# Patient Record
Sex: Male | Born: 1965 | Race: Black or African American | Hispanic: No | Marital: Single | Smoking: Never smoker
Health system: Southern US, Community
[De-identification: ages and names within clinical notes are randomized; demographics above are authoritative.]

## PROBLEM LIST (undated history)

## (undated) DIAGNOSIS — I10 Essential (primary) hypertension: Secondary | ICD-10-CM

## (undated) HISTORY — PX: TOOTH EXTRACTION: SUR596

---

## 2016-07-10 ENCOUNTER — Encounter (HOSPITAL_COMMUNITY): Payer: Self-pay | Admitting: Emergency Medicine

## 2016-07-10 ENCOUNTER — Ambulatory Visit (HOSPITAL_COMMUNITY)
Admission: EM | Admit: 2016-07-10 | Discharge: 2016-07-10 | Disposition: A | Discharge status: Home / Self Care | Payer: Self-pay | Attending: Family Medicine | Admitting: Family Medicine

## 2016-07-10 DIAGNOSIS — R319 Hematuria, unspecified: Secondary | ICD-10-CM

## 2016-07-10 DIAGNOSIS — I10 Essential (primary) hypertension: Secondary | ICD-10-CM

## 2016-07-10 LAB — POCT URINALYSIS DIP (DEVICE)
Bilirubin Urine: NEGATIVE
Glucose, UA: NEGATIVE mg/dL
Ketones, ur: NEGATIVE mg/dL
Leukocytes, UA: NEGATIVE
Nitrite: NEGATIVE
PH: 5.5 (ref 5.0–8.0)
PROTEIN: NEGATIVE mg/dL
SPECIFIC GRAVITY, URINE: 1.025 (ref 1.005–1.030)
UROBILINOGEN UA: 0.2 mg/dL (ref 0.0–1.0)

## 2016-07-10 MED ORDER — HYDROCHLOROTHIAZIDE 25 MG PO TABS: 25.0000 mg | ORAL_TABLET | Freq: Every day | ORAL | 0 refills | Status: DC

## 2016-07-10 NOTE — ED Triage Notes (Signed)
Pt reports x1 episode of hematuria on 9/14  Reports "discomfort in the genital area"... 1/10  Denies inj/trauma, penile d/c, dysuria  A&O x4... NAD

## 2016-07-10 NOTE — ED Provider Notes (Signed)
CSN: [id number]     Arrival date & time 07/10/16  1727 History   First MD Initiated Contact with Patient 07/10/16 1829     Chief Complaint  Patient presents with  . Hematuria   (Consider location/radiation/quality/duration/timing/severity/associated sxs/prior Treatment) Javier Harmon is a well-appearing 50 y.o male, with hx of HTN, has no PCP, presents today for hematuria. He noticed blood in his urine 3 days ago. He said his urine had a pink-tinge color to it. He denies dysuria, no urinary frequency, or urinary urgency. He is sexually active with 1 partner. He denies penile discharge, penile pain, penile rash, testicular pain/swelling/redness. He has no recent weight loss, no abdominal pain, no N/V.   Noted his BP to be elevated today. He denies dizziness, blurry vision, headache, chest pain.       History reviewed. No pertinent past medical history. History reviewed. No pertinent surgical history. History reviewed. No pertinent family history. Social History  Substance Use Topics  . Smoking status: Never Smoker  . Smokeless tobacco: Never Used  . Alcohol use Yes    Review of Systems  Constitutional: Negative for chills, fatigue, fever and unexpected weight change.  Eyes: Negative for visual disturbance.  Respiratory: Negative for cough and shortness of breath.   Cardiovascular: Negative for chest pain, palpitations and leg swelling.  Gastrointestinal: Negative for abdominal pain, diarrhea, nausea and vomiting.  Genitourinary: Positive for flank pain and hematuria. Negative for decreased urine volume, discharge, dysuria, frequency, penile pain, penile swelling, scrotal swelling, testicular pain and urgency.  Skin: Negative for rash.  Neurological: Negative for dizziness, weakness and headaches.    Allergies  Review of patient's allergies indicates no known allergies.  Home Medications   Prior to Admission medications   Medication Sig Start Date End Date Taking? Authorizing  Provider  hydrochlorothiazide (HYDRODIURIL) 25 MG tablet Take 1 tablet (25 mg total) by mouth daily. 07/10/16   Lucia EstelleFeng Solita Macadam, NP   Meds Ordered and Administered this Visit  Medications - No data to display  BP 190/85 (BP Location: Left Arm)   Pulse 65   Temp 98.8 F (37.1 C) (Oral)   Resp 18   SpO2 100%  No data found.   Physical Exam  Constitutional: He is oriented to person, place, and time. He appears well-developed and well-nourished. No distress.  HENT:  Head: Normocephalic and atraumatic.  Eyes: EOM are normal. Pupils are equal, round, and reactive to light.  Neck: Normal range of motion. Neck supple.  Cardiovascular: Normal rate, regular rhythm, normal heart sounds and intact distal pulses.   No murmur heard. Pulmonary/Chest: Effort normal and breath sounds normal. No respiratory distress.  Abdominal: Soft. Bowel sounds are normal. He exhibits no distension. There is no tenderness.  Musculoskeletal: Normal range of motion.  Neurological: He is alert and oriented to person, place, and time.  Skin: Skin is warm. He is not diaphoretic.  Nursing note and vitals reviewed.   Urgent Care Course   Clinical Course    Procedures (including critical care time)  Labs Review Labs Reviewed  POCT URINALYSIS DIP (DEVICE) - Abnormal; Notable for the following:       Result Value   Hgb urine dipstick SMALL (*)    All other components within normal limits    Imaging Review No results found.      MDM   1. Hematuria   2. Essential hypertension    Patient appears well. His exam was normal. UA pos for Hgb. He is otherwise asymptomatic.  Hematuria: f/u with [facility]. Potential differentials discussed with patient. Call tomorrow to make an appt  HTN: Start HCTZ 25mg . Need to establish care with a PCP no later than 2 weeks to have HTN re-evaluation.    Lucia Estelle, NP 07/10/16 [phone]

## 2017-02-26 ENCOUNTER — Encounter (HOSPITAL_COMMUNITY): Payer: Self-pay

## 2017-02-26 ENCOUNTER — Ambulatory Visit (HOSPITAL_COMMUNITY)
Admission: EM | Admit: 2017-02-26 | Discharge: 2017-02-26 | Disposition: A | Discharge status: Home / Self Care | Payer: Self-pay | Attending: Internal Medicine | Admitting: Internal Medicine

## 2017-02-26 DIAGNOSIS — H1033 Unspecified acute conjunctivitis, bilateral: Secondary | ICD-10-CM

## 2017-02-26 DIAGNOSIS — I10 Essential (primary) hypertension: Secondary | ICD-10-CM

## 2017-02-26 MED ORDER — ERYTHROMYCIN 5 MG/GM OP OINT: TOPICAL_OINTMENT | OPHTHALMIC | 0 refills | Status: AC

## 2017-02-26 MED ORDER — HYDROCHLOROTHIAZIDE 25 MG PO TABS: 25.0000 mg | ORAL_TABLET | Freq: Every day | ORAL | 2 refills | Status: AC

## 2017-02-26 NOTE — ED Provider Notes (Signed)
CSN: [id number]     Arrival date & time 02/26/17  1213 History   First MD Initiated Contact with Patient 02/26/17 1323     Chief Complaint  Patient presents with  . Conjunctivitis   (Consider location/radiation/quality/duration/timing/severity/associated sxs/prior Treatment) 51 year old male presents to clinic for evaluation of bilateral conjunctivitis ongoing for 1 week, worsening, tried over-the-counter eyedrops without relief. Denies pain, denies blurred vision, denies photosensitivity, denies alterations in vision.  Also has hypertension, does not have primary care, was seen in this office back in September, started on hydrochlorothiazide, given a one-month supply, he did not establish with primary care. Current blood pressure 179 /138, denies chest pain, shortness of breath, weakness, dizziness, blurred vision, swelling hands, feet, ankles, unilateral weakness, numbness, or other symptoms. Sleeps flat in bed at night with one pillow   The history is provided by the patient.  Conjunctivitis  This is a recurrent problem. The current episode started more than 2 days ago (1 week). The problem occurs constantly. The problem has been gradually worsening. Pertinent negatives include no chest pain, no abdominal pain, no headaches and no shortness of breath. Nothing aggravates the symptoms. Nothing relieves the symptoms. Treatments tried: OTC eye drops. The treatment provided no relief.    History reviewed. No pertinent past medical history. History reviewed. No pertinent surgical history. No family history on file. Social History  Substance Use Topics  . Smoking status: Never Smoker  . Smokeless tobacco: Never Used  . Alcohol use Yes    Review of Systems  Constitutional: Negative.   HENT: Negative.   Eyes: Positive for discharge, redness and itching. Negative for photophobia and pain.  Respiratory: Negative for cough, shortness of breath and wheezing.   Cardiovascular: Negative for chest  pain and palpitations.  Gastrointestinal: Negative for abdominal pain, diarrhea, nausea and vomiting.  Musculoskeletal: Negative.   Skin: Negative.   Neurological: Negative for dizziness, syncope, light-headedness and headaches.    Allergies  Patient has no known allergies.  Home Medications   Prior to Admission medications   Medication Sig Start Date End Date Taking? Authorizing Provider  erythromycin ophthalmic ointment Place a 1/2 inch ribbon of ointment into the lower eyelid twice daily 02/26/17   Dorena BodoKennard, Brihana Quickel, NP  hydrochlorothiazide (HYDRODIURIL) 25 MG tablet Take 1 tablet (25 mg total) by mouth daily. 02/26/17   Dorena BodoKennard, Damier Disano, NP   Meds Ordered and Administered this Visit  Medications - No data to display  BP (!) 179/136 (BP Location: Right Arm)   Pulse 78   Temp 98.6 F (37 C) (Oral)   Resp 20   SpO2 98%  No data found.   Physical Exam  Constitutional: He is oriented to person, place, and time. He appears well-developed and well-nourished. No distress.  HENT:  Head: Normocephalic and atraumatic.  Right Ear: External ear normal.  Left Ear: External ear normal.  Eyes: EOM and lids are normal. Pupils are equal, round, and reactive to light. Right eye exhibits discharge. Left eye exhibits discharge. Right conjunctiva is injected. Right conjunctiva has no hemorrhage. Left conjunctiva is injected. Left conjunctiva has no hemorrhage.  Neck: Normal range of motion.  Cardiovascular: Normal rate, regular rhythm and intact distal pulses.   Pulmonary/Chest: Effort normal and breath sounds normal.  Abdominal: Soft. Bowel sounds are normal.  Neurological: He is alert and oriented to person, place, and time.  Skin: Skin is warm and dry. Capillary refill takes less than 2 seconds. He is not diaphoretic.  Psychiatric: He has a normal mood  and affect. His behavior is normal.  Nursing note and vitals reviewed.   Urgent Care Course     Procedures (including critical care  time)  Labs Review Labs Reviewed - No data to display  Imaging Review No results found.     MDM   1. Acute conjunctivitis of both eyes, unspecified acute conjunctivitis type   2. Essential hypertension    Started on erythromycin ophthalmic ointment for conjunctivitis, also encouraged use of an antihistamine such as Claritin or Zyrtec.  Provided counseling on diet, exercise, and other causes of hypertension, given prescription for hydrochlorothiazide, strongly encouraged to follow-up with [facility] to establish for primary care, emphasized the need for management of chronic conditions for long-term health.     Dorena Bodo, NP 02/26/17 1353

## 2017-02-26 NOTE — Discharge Instructions (Signed)
For you conjunctivitis, I have prescribed erythromycin ointment. Apply a 1/2 inch ribbon under the lower eye lid two to three times a day for 1 week. I also recommend an over the counter antihistamine such as Claritin or Zyrtec daily for the remainder of the season.  For your hypertension, I have started you on hydrochlorothiazide. Take 1 tablet every day. I have given a 3 month supply of this medicine, as he can take up to 3 months to establish with community [facility]. You'll need to follow-up with them as managing hypertension requires close follow-up and often multiple medication changes. Please call community [facility] wellness tomorrow morning, and schedule an appointment to establish care.

## 2017-02-26 NOTE — ED Triage Notes (Signed)
Pt having redness and itchy in both eyes since Friday. Also having drainage and crust. Did try some otc eye drops, no fever.

## 2017-12-04 ENCOUNTER — Emergency Department (HOSPITAL_COMMUNITY): Payer: Self-pay

## 2017-12-04 ENCOUNTER — Encounter (HOSPITAL_COMMUNITY): Payer: Self-pay | Admitting: Emergency Medicine

## 2017-12-04 ENCOUNTER — Emergency Department (HOSPITAL_COMMUNITY)
Admission: EM | Admit: 2017-12-04 | Discharge: 2017-12-04 | Disposition: A | Discharge status: Home / Self Care | Payer: Self-pay | Attending: Emergency Medicine | Admitting: Emergency Medicine

## 2017-12-04 DIAGNOSIS — Z79899 Other long term (current) drug therapy: Secondary | ICD-10-CM | POA: Insufficient documentation

## 2017-12-04 DIAGNOSIS — M791 Myalgia, unspecified site: Secondary | ICD-10-CM | POA: Insufficient documentation

## 2017-12-04 DIAGNOSIS — J111 Influenza due to unidentified influenza virus with other respiratory manifestations: Secondary | ICD-10-CM | POA: Insufficient documentation

## 2017-12-04 DIAGNOSIS — R509 Fever, unspecified: Secondary | ICD-10-CM | POA: Insufficient documentation

## 2017-12-04 DIAGNOSIS — R6889 Other general symptoms and signs: Secondary | ICD-10-CM

## 2017-12-04 DIAGNOSIS — I10 Essential (primary) hypertension: Secondary | ICD-10-CM | POA: Insufficient documentation

## 2017-12-04 HISTORY — DX: Essential (primary) hypertension: I10

## 2017-12-04 LAB — INFLUENZA PANEL BY PCR (TYPE A & B)
INFLAPCR: POSITIVE — AB
Influenza B By PCR: NEGATIVE

## 2017-12-04 MED ORDER — IBUPROFEN 800 MG PO TABS
800.0000 mg | ORAL_TABLET | Freq: Once | ORAL | Status: AC
  Administered 2017-12-04: 800 mg via ORAL
  Filled 2017-12-04: qty 1

## 2017-12-04 MED ORDER — IBUPROFEN 800 MG PO TABS: 800.0000 mg | ORAL_TABLET | Freq: Three times a day (TID) | ORAL | 0 refills | Status: AC

## 2017-12-04 NOTE — ED Notes (Signed)
Pt understood dc material. NAD noted. Script given at dc  

## 2017-12-04 NOTE — ED Notes (Signed)
Patient transported to X-ray 

## 2017-12-04 NOTE — ED Triage Notes (Signed)
Pt reports flu like S/S since Fri. Cough, fever/chills, body aches.

## 2017-12-04 NOTE — ED Provider Notes (Signed)
[facility] Provider Note   CSN: [id number] Arrival date & time: 12/04/17  0356     History   Chief Complaint Chief Complaint  Patient presents with  . Flu like S/S    HPI Javier Harmon is a 52 y.o. male.  The history is provided by the patient. No language interpreter was used.  Influenza  Presenting symptoms: cough, fever and myalgias   Presenting symptoms: no diarrhea, no fatigue, no nausea, no rhinorrhea, no shortness of breath, no sore throat and no vomiting   Severity:  Moderate Onset quality:  Gradual Duration:  4 days Progression:  Unchanged Chronicity:  New Relieved by:  Nothing Worsened by:  Nothing Ineffective treatments:  None tried Associated symptoms: no chills, no decreased appetite, no ear pain, no mental status change, no neck stiffness and no syncope   Risk factors: not elderly     Past Medical History:  Diagnosis Date  . Hypertension     There are no active problems to display for this patient.   Past Surgical History:  Procedure Laterality Date  . TOOTH EXTRACTION         Home Medications    Prior to Admission medications   Medication Sig Start Date End Date Taking? Authorizing Provider  erythromycin ophthalmic ointment Place a 1/2 inch ribbon of ointment into the lower eyelid twice daily 02/26/17   Dorena BodoKennard, Lawrence, NP  hydrochlorothiazide (HYDRODIURIL) 25 MG tablet Take 1 tablet (25 mg total) by mouth daily. 02/26/17   Dorena BodoKennard, Lawrence, NP    Family History No family history on file.  Social History Social History   Tobacco Use  . Smoking status: Never Smoker  . Smokeless tobacco: Never Used  Substance Use Topics  . Alcohol use: Yes    Comment: socially  . Drug use: No     Allergies   Patient has no known allergies.   Review of Systems Review of Systems  Constitutional: Positive for fever. Negative for chills, decreased appetite and fatigue.  HENT: Negative for ear pain,  rhinorrhea and sore throat.   Respiratory: Positive for cough. Negative for chest tightness and shortness of breath.   Cardiovascular: Negative for chest pain, palpitations and leg swelling.  Gastrointestinal: Negative for diarrhea, nausea and vomiting.  Genitourinary: Negative for dysuria.  Musculoskeletal: Positive for myalgias. Negative for neck stiffness.  All other systems reviewed and are negative.    Physical Exam Updated Vital Signs BP (!) 176/97 (BP Location: Right Arm)   Pulse 71   Temp 97.8 F (36.6 C) (Oral)   Resp 18   Ht 5\' 7"  (1.702 m)   Wt 90.7 kg (200 lb)   SpO2 96%   BMI 31.32 kg/m   Physical Exam  Constitutional: He is oriented to person, place, and time. He appears well-developed and well-nourished. No distress.  HENT:  Head: Normocephalic and atraumatic.  Mouth/Throat: No oropharyngeal exudate.  Eyes: Conjunctivae are normal. Pupils are equal, round, and reactive to light.  Neck: Normal range of motion. Neck supple.  Cardiovascular: Normal rate, regular rhythm, normal heart sounds and intact distal pulses.  Pulmonary/Chest: Effort normal and breath sounds normal. No stridor. He has no wheezes. He has no rales.  Abdominal: Soft. Bowel sounds are normal. He exhibits no mass. There is no tenderness. There is no rebound and no guarding.  Musculoskeletal: Normal range of motion.  Neurological: He is alert and oriented to person, place, and time. He displays normal reflexes.  Skin: Skin is  warm and dry. Capillary refill takes less than 2 seconds.  Psychiatric: He has a normal mood and affect.     ED Treatments / Results  Labs (all labs ordered are listed, but only abnormal results are displayed) Results for orders placed or performed during the hospital encounter of 07/10/16  POCT urinalysis dip (device)  Result Value Ref Range   Glucose, UA NEGATIVE NEGATIVE mg/dL   Bilirubin Urine NEGATIVE NEGATIVE   Ketones, ur NEGATIVE NEGATIVE mg/dL   Specific  Gravity, Urine 1.025 1.005 - 1.030   Hgb urine dipstick SMALL (A) NEGATIVE   pH 5.5 5.0 - 8.0   Protein, ur NEGATIVE NEGATIVE mg/dL   Urobilinogen, UA 0.2 0.0 - 1.0 mg/dL   Nitrite NEGATIVE NEGATIVE   Leukocytes, UA NEGATIVE NEGATIVE   Dg Chest 2 View  Result Date: 12/04/2017 CLINICAL DATA:  Acute onset of diarrhea.  Cough and fever. EXAM: CHEST  2 VIEW COMPARISON:  None. FINDINGS: The lungs are well-aerated. Pulmonary vascularity is at the upper limits of normal. Mild peribronchial thickening is noted. There is no evidence of focal opacification, pleural effusion or pneumothorax. The heart is normal in size; the mediastinal contour is within normal limits. No acute osseous abnormalities are seen. IMPRESSION: Mild peribronchial thickening noted.  Lungs otherwise clear. Electronically Signed   By: Roanna Raider M.D.   On: 12/04/2017 04:54     Procedures Procedures (including critical care time)  Medications Ordered in ED Medications  ibuprofen (ADVIL,MOTRIN) tablet 800 mg (800 mg Oral Given 12/04/17 0436)       Final Clinical Impressions(s) / ED Diagnoses   Patient presents with flu symptoms since tHURSDAY.  hE IS OUTSIDE THE WINDOW FOR Tamiflu.  Alternate tylenol and ibuprofen and drink copious fluids.    Return for weakness, numbness, changes in vision or speech,  fevers > 100.4 unrelieved by medication, shortness of breath, intractable vomiting, or diarrhea, abdominal pain, Inability to tolerate liquids or food, cough, altered mental status or any concerns. No signs of systemic illness or infection. The patient is nontoxic-appearing on exam and vital signs are within normal limits.    I have reviewed the triage vital signs and the nursing notes. Pertinent labs &imaging results that were available during my care of the patient were reviewed by me and considered in my medical decision making (see chart for details).  After history, exam, and medical workup I feel the patient has  been appropriately medically screened and is safe for discharge home. Pertinent diagnoses were discussed with the patient. Patient was given return precautions.      Rollie Hynek, MD 12/04/17 1610

## 2019-10-06 IMAGING — CR DG CHEST 2V
2 series · 2 of 2 positions shown · non-contrast
Comparison: None.

CLINICAL DATA: Acute onset of diarrhea.  Cough and fever.

EXAM:
CHEST  2 VIEW

[chest pa]
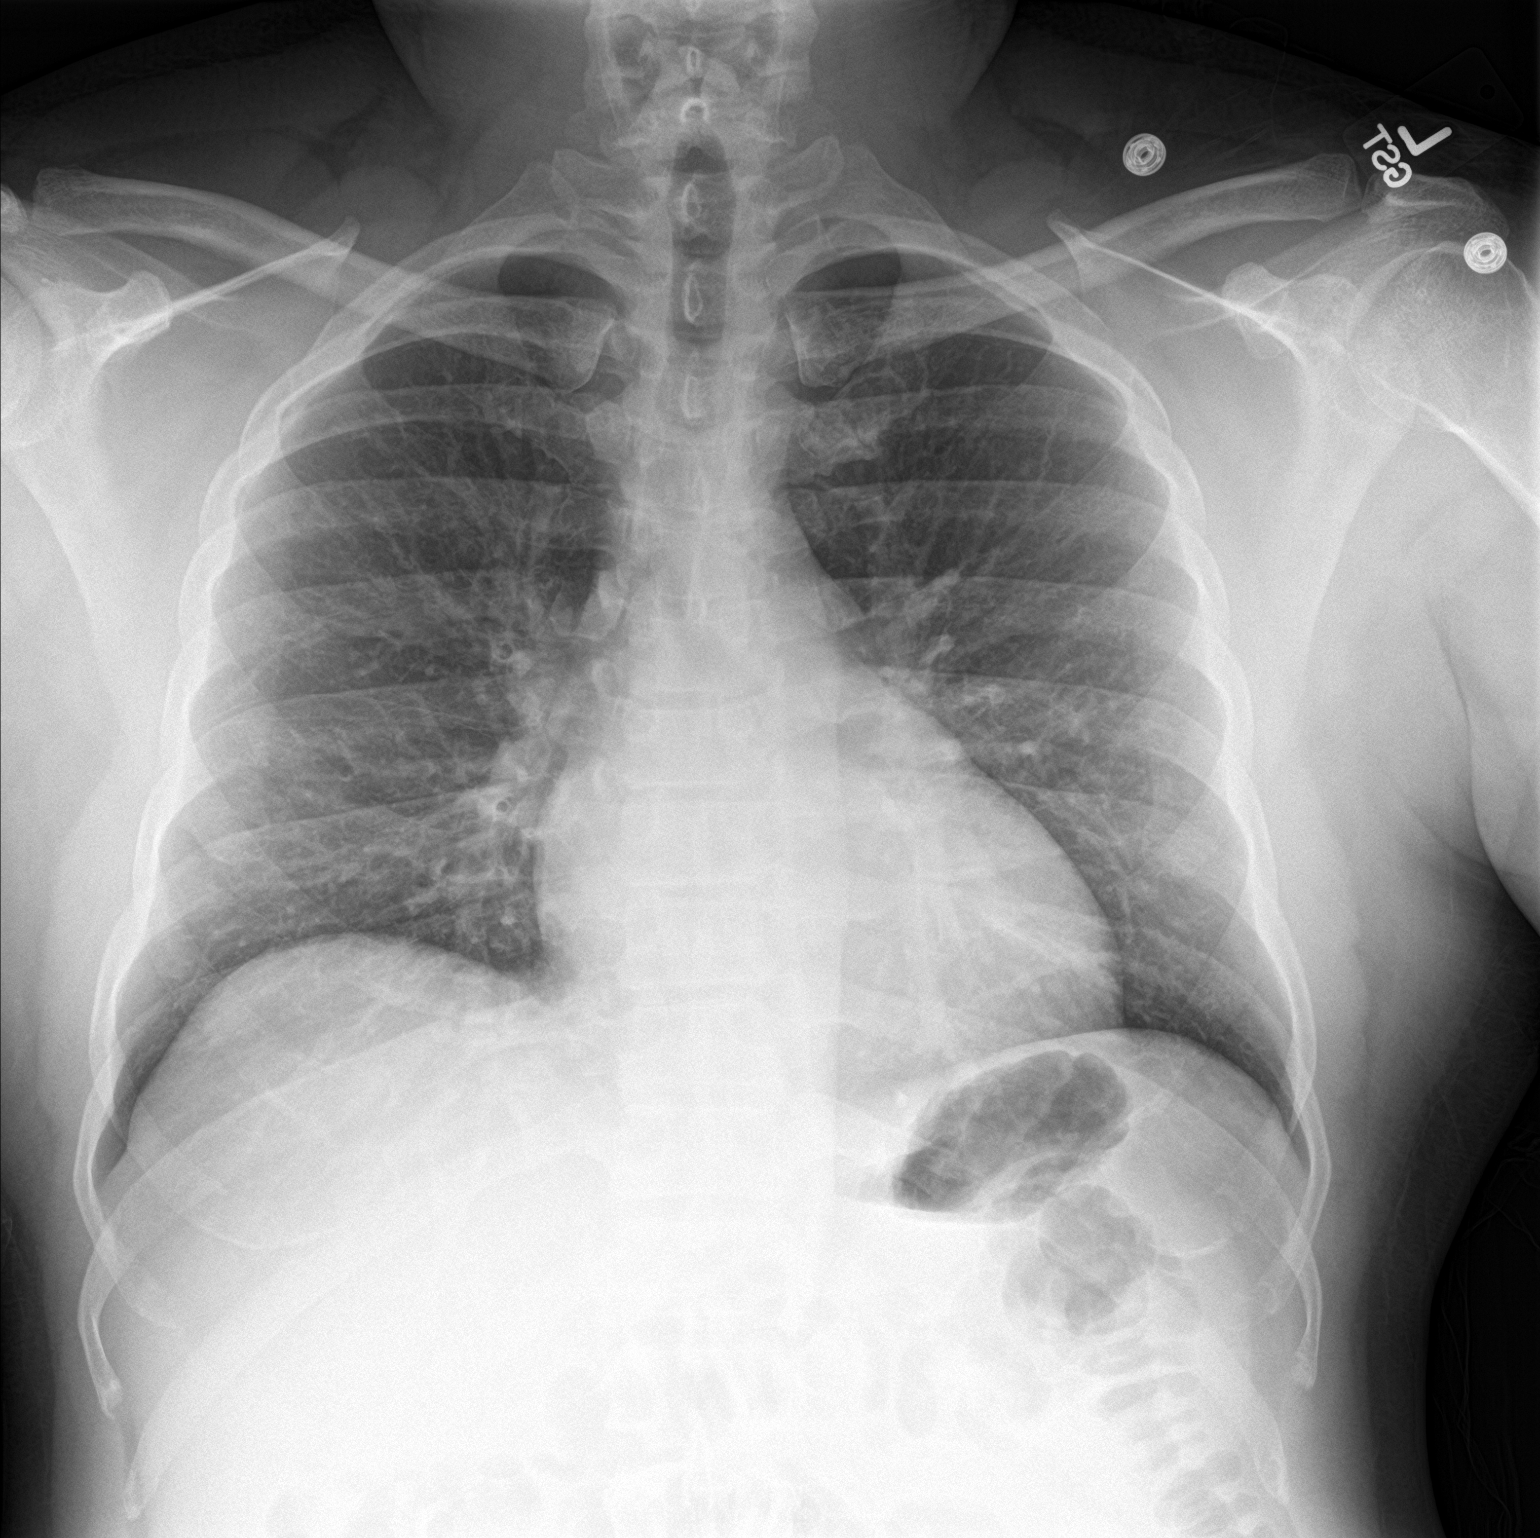

[chest lat]
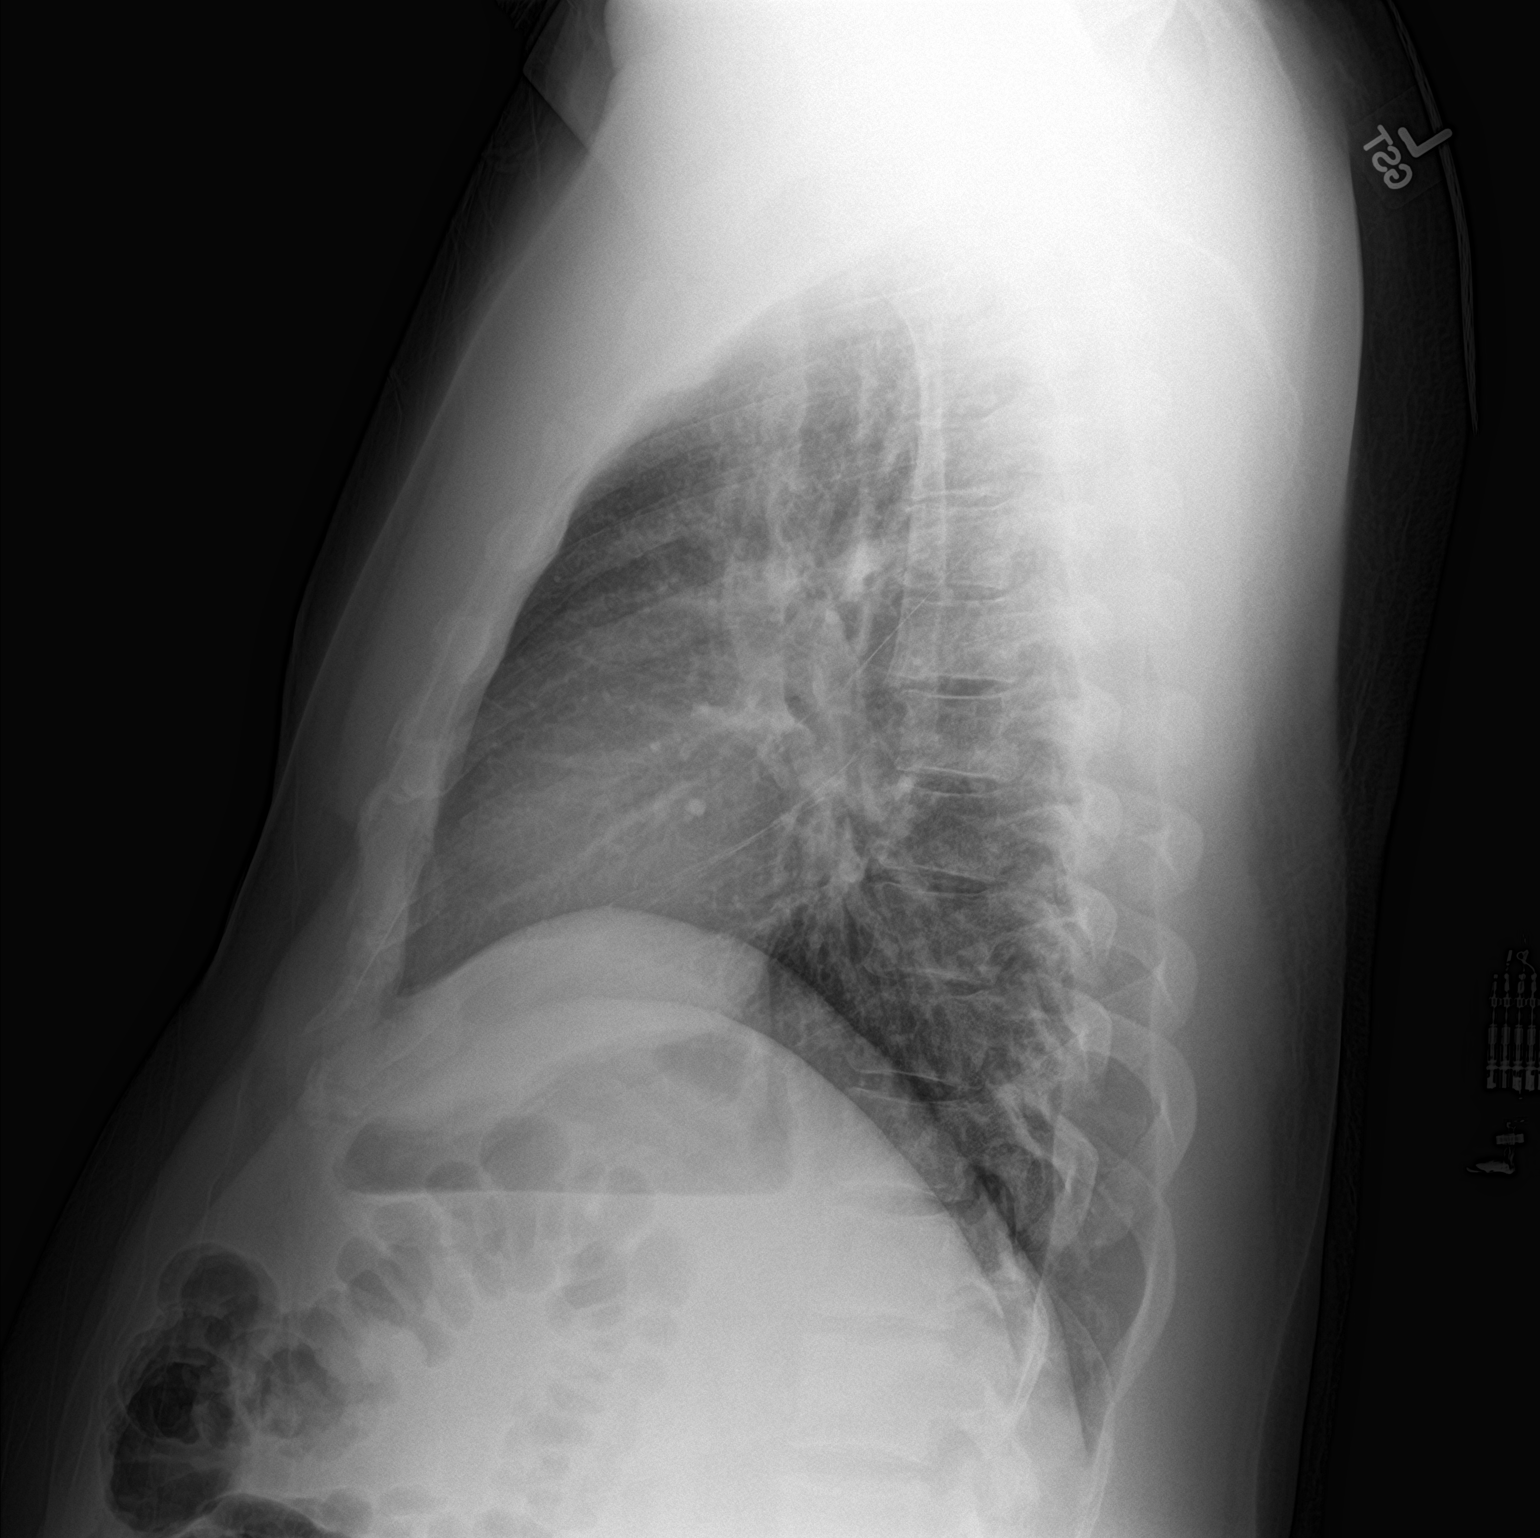

[2 of 2 positions shown; findings below may reference images not displayed]

FINDINGS: The lungs are well-aerated. Pulmonary vascularity is at the upper
limits of normal. Mild peribronchial thickening is noted. There is
no evidence of focal opacification, pleural effusion or
pneumothorax.

The heart is normal in size; the mediastinal contour is within
normal limits. No acute osseous abnormalities are seen.
IMPRESSION: Mild peribronchial thickening noted.  Lungs otherwise clear.
